# Patient Record
Sex: Male | Born: 1995 | Race: Black or African American | Hispanic: No | Marital: Single | Smoking: Current every day smoker
Health system: Southern US, Community
[De-identification: ages and names within clinical notes are randomized; demographics above are authoritative.]

## PROBLEM LIST (undated history)

## (undated) HISTORY — PX: OTHER SURGICAL HISTORY: SHX169

## (undated) HISTORY — PX: HERNIA REPAIR: SHX51

---
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
## 2017-06-24 ENCOUNTER — Encounter (HOSPITAL_COMMUNITY): Payer: Self-pay | Admitting: Emergency Medicine

## 2017-06-24 ENCOUNTER — Emergency Department (HOSPITAL_COMMUNITY)
Admission: EM | Admit: 2017-06-24 | Discharge: 2017-06-24 | Disposition: A | Discharge status: Home / Self Care | Payer: Self-pay | Attending: Emergency Medicine | Admitting: Emergency Medicine

## 2017-06-24 DIAGNOSIS — F17210 Nicotine dependence, cigarettes, uncomplicated: Secondary | ICD-10-CM | POA: Insufficient documentation

## 2017-06-24 DIAGNOSIS — R45851 Suicidal ideations: Secondary | ICD-10-CM | POA: Insufficient documentation

## 2017-06-24 NOTE — ED Triage Notes (Signed)
Patient arrived with [employer] officers handcuffed , [employer] reported that pt. stated suicidal ideation , his girlfriend reported to the police that he ingested 30 pills of unknown pills this evening , pt. Uncooperative at arrival - will not identify himself , he stated birthday Apr 12, 1996. Alert and respirations unlabored , argumentative with [employer] officers at triage .

## 2017-06-24 NOTE — ED Provider Notes (Signed)
[facility]-EMERGENCY DEPT Provider Note   CSN: [id number] Arrival date & time: 06/24/17  0135   By signing my name below, I, [name], attest that this documentation has been prepared under the direction and in the presence of [name], [name], * . Electronically Signed: [name], Scribe. 06/24/2017. 2:40 AM.   History   Chief Complaint Chief Complaint  Patient presents with  . Suicidal    The history is provided by the patient. No language interpreter was used.     HPI Comments:  Kyle Sherman is a 21 y.o. male who presents to the Emergency Department escorted by police for SI. Pt denies SI. He states he got into an argument with his girlfriend a few hours PTA and told her he was going to take pills. He took a bottle of ASA, emptied it onto the table and then drank water in front of his girlfriend  to make her think he'd overdosed so his girlfriend called 911. When he told her she hadn't taken the pills she hung up on 911 but police responded and brought pt to the ED.  He denies HI and SI at this time. Pt has no physical complaints or associated symptoms at this time.    History reviewed. No pertinent past medical history.  There are no active problems to display for this patient.   History reviewed. No pertinent surgical history.     Home Medications    Prior to Admission medications   Not on File    Family History No family history on file.  Social History Social History  Substance Use Topics  . Smoking status: Current Every Day Smoker  . Smokeless tobacco: Never Used  . Alcohol use Yes     Allergies   Patient has no known allergies.   Review of Systems Review of Systems  Constitutional: Negative for chills and fever.  Respiratory: Negative for shortness of breath.   Cardiovascular: Negative for chest pain.  Psychiatric/Behavioral: Negative for agitation, behavioral problems, self-injury and suicidal ideas. The patient is not  nervous/anxious.   All other systems reviewed and are negative.    Physical Exam Updated Vital Signs BP (!) 145/128 (BP Location: Right Arm)   Pulse 63   Temp 98.2 F (36.8 C) (Oral)   Resp 19   SpO2 95%   Physical Exam  Constitutional: He is oriented to person, place, and time. He appears well-developed and well-nourished. No distress.  HENT:  Head: Normocephalic and atraumatic.  Right Ear: Hearing normal.  Left Ear: Hearing normal.  Nose: Nose normal.  Mouth/Throat: Oropharynx is clear and moist and mucous membranes are normal.  Eyes: Conjunctivae and EOM are normal. Pupils are equal, round, and reactive to light.  Neck: Normal range of motion. Neck supple.  Cardiovascular: Regular rhythm, S1 normal and S2 normal.  Exam reveals no gallop and no friction rub.   No murmur heard. Pulmonary/Chest: Effort normal and breath sounds normal. No respiratory distress. He exhibits no tenderness.  Abdominal: Soft. Normal appearance and bowel sounds are normal. There is no hepatosplenomegaly. There is no tenderness. There is no rebound, no guarding, no tenderness at McBurney's point and negative Murphy's sign. No hernia.  Musculoskeletal: Normal range of motion.  Neurological: He is alert and oriented to person, place, and time. He has normal strength. No cranial nerve deficit or sensory deficit. Coordination normal. GCS eye subscore is 4. GCS verbal subscore is 5. GCS motor subscore is 6.  Skin: Skin is warm, dry  and intact. No rash noted. No cyanosis.  Psychiatric: He has a normal mood and affect. His speech is normal and behavior is normal. Thought content normal.  Nursing note and vitals reviewed.    ED Treatments / Results  DIAGNOSTIC STUDIES:  Oxygen Saturation is 95% on RA, adequate by my interpretation.    COORDINATION OF CARE:  2:39 AM Discussed treatment plan with pt at bedside and pt agreed to plan.  Labs (all labs ordered are listed, but only abnormal results are  displayed) Labs Reviewed - No data to display  EKG  EKG Interpretation None       Radiology No results found.  Procedures Procedures (including critical care time)  Medications Ordered in ED Medications - No data to display   Initial Impression / Assessment and Plan / ED Course  I have reviewed the triage vital signs and the nursing notes.  Pertinent labs & imaging results that were available during my care of the patient were reviewed by me and considered in my medical decision making (see chart for details).     Patient was brought to the emergency department by police. Patient reports that he had an argument with his friend earlier tonight, pretended to take a bunch of Advil tablets to get her attention. She called 911 when he made a gesture, police responded to the home. He reports this is all a misunderstanding in a mistake. He did not take any pills tonight. He never intended to take pills. He is not homicidal or suicidal. Patient reports full over his actions, contract for safety. I do not feel he warrants any further workup at this time, was given resources and counseled return if he has any thoughts of harming himself.  Final Clinical Impressions(s) / ED Diagnoses   Final diagnoses:  Suicidal ideation    New Prescriptions New Prescriptions   No medications on file  I personally performed the services described in this documentation, which was scribed in my presence. The recorded information has been reviewed and is accurate.     Gilda CreasePollina, Danella Philson J, MD 06/24/17 [phone]

## 2017-06-24 NOTE — ED Notes (Signed)
Pt refused discharge vitals 

## 2017-06-24 NOTE — ED Notes (Signed)
Pt st's he got into a argument with his girlfriend.  Pt st's he told his girlfriend that he was gonna take a bottle of pills (advil)  Pt st's he did not take them but his girlfriend had already called 911.  Pt denies being SI or HI.

## 2017-06-24 NOTE — ED Notes (Signed)
Pt attempting to find a ride home; no bus passes available

## 2017-06-24 NOTE — ED Notes (Signed)
Staffing office notified for pt.'s sitter . 

## 2017-07-31 ENCOUNTER — Encounter (HOSPITAL_COMMUNITY): Payer: Self-pay | Admitting: Emergency Medicine

## 2017-07-31 ENCOUNTER — Emergency Department (HOSPITAL_COMMUNITY)
Admission: EM | Admit: 2017-07-31 | Discharge: 2017-07-31 | Disposition: A | Discharge status: Home / Self Care | Payer: Self-pay | Attending: Emergency Medicine | Admitting: Emergency Medicine

## 2017-07-31 DIAGNOSIS — R110 Nausea: Secondary | ICD-10-CM | POA: Insufficient documentation

## 2017-07-31 DIAGNOSIS — F17210 Nicotine dependence, cigarettes, uncomplicated: Secondary | ICD-10-CM | POA: Insufficient documentation

## 2017-07-31 DIAGNOSIS — R05 Cough: Secondary | ICD-10-CM | POA: Insufficient documentation

## 2017-07-31 DIAGNOSIS — J00 Acute nasopharyngitis [common cold]: Secondary | ICD-10-CM | POA: Insufficient documentation

## 2017-07-31 LAB — RAPID STREP SCREEN (MED CTR MEBANE ONLY): Streptococcus, Group A Screen (Direct): NEGATIVE

## 2017-07-31 MED ORDER — ACETAMINOPHEN 325 MG PO TABS
650.0000 mg | ORAL_TABLET | Freq: Once | ORAL | Status: AC | PRN
  Administered 2017-07-31: 650 mg via ORAL
  Filled 2017-07-31: qty 2

## 2017-07-31 MED ORDER — ONDANSETRON 4 MG PO TBDP
ORAL_TABLET | ORAL | 0 refills | Status: DC
Start: 2017-07-31 — End: 2018-04-17

## 2017-07-31 MED ORDER — BENZONATATE 100 MG PO CAPS: 100.0000 mg | ORAL_CAPSULE | Freq: Three times a day (TID) | ORAL | 0 refills | Status: DC

## 2017-07-31 NOTE — Discharge Instructions (Signed)
Take tylenol 2 pills 4 times a day and motrin 4 pills 3 times a day.  Drink plenty of fluids.  Return for worsening shortness of breath, headache, confusion. Follow up with your family doctor.   Try Sudafed or congestion. This is the medication you have to show your driver's license and get from the pharmacy.

## 2017-07-31 NOTE — ED Provider Notes (Signed)
[facility]-EMERGENCY DEPT Provider Note   CSN: [id number] Arrival date & time: 07/31/17  4098     History   Chief Complaint Chief Complaint  Patient presents with  . Fever  . Sore Throat    HPI Kyle Sherman is a 21 y.o. male.  21 yo M with a cc of cough, congestion fevers, sore throat.  Going on for the past couple of days.  Denies sick contacts.  Has some nausea but denies vomiting. Diffuse myalgias. Denies tick bites. Denies dysuria. Denies abdominal pain. Denies shortness breath.   The history is provided by the patient.  Illness  This is a new problem. The current episode started less than 1 hour ago. The problem occurs constantly. The problem has not changed since onset.Associated symptoms include shortness of breath. Pertinent negatives include no chest pain, no abdominal pain and no headaches. Nothing aggravates the symptoms. Nothing relieves the symptoms. He has tried nothing for the symptoms. The treatment provided no relief.    No past medical history on file.  There are no active problems to display for this patient.   Past Surgical History:  Procedure Laterality Date  . Arm Tendon repair Right   . HERNIA REPAIR         Home Medications    Prior to Admission medications   Medication Sig Start Date End Date Taking? Authorizing Provider  benzonatate (TESSALON) 100 MG capsule Take 1 capsule (100 mg total) by mouth every 8 (eight) hours. 07/31/17   Melene Plan, DO  ondansetron (ZOFRAN ODT) 4 MG disintegrating tablet 4mg  ODT q4 hours prn nausea/vomit 07/31/17   Melene Plan, DO    Family History No family history on file.  Social History Social History  Substance Use Topics  . Smoking status: Current Every Day Smoker  . Smokeless tobacco: Never Used  . Alcohol use Yes     Allergies   Patient has no known allergies.   Review of Systems Review of Systems  Constitutional: Positive for fever. Negative for chills.  HENT: Positive for congestion. Negative  for facial swelling.   Eyes: Negative for discharge and visual disturbance.  Respiratory: Positive for shortness of breath.   Cardiovascular: Negative for chest pain and palpitations.  Gastrointestinal: Negative for abdominal pain, diarrhea and vomiting.  Musculoskeletal: Negative for arthralgias and myalgias.  Skin: Negative for color change and rash.  Neurological: Negative for tremors, syncope and headaches.  Psychiatric/Behavioral: Negative for confusion and dysphoric mood.     Physical Exam Updated Vital Signs BP 121/74 (BP Location: Right Arm)   Pulse 79   Temp 99.6 F (37.6 C) (Oral)   Resp 12   Ht 5\' 11"  (1.803 m)   Wt 68 kg (150 lb)   SpO2 97%   BMI 20.92 kg/m   Physical Exam  Constitutional: He is oriented to person, place, and time. He appears well-developed and well-nourished.  HENT:  Head: Normocephalic and atraumatic.  Swollen turbinates, posterior nasal drip, no noted sinus ttp, tm normal bilaterally.    Eyes: Pupils are equal, round, and reactive to light. EOM are normal.  Neck: Normal range of motion. Neck supple. No JVD present.  Cardiovascular: Normal rate and regular rhythm.  Exam reveals no gallop and no friction rub.   No murmur heard. Pulmonary/Chest: No respiratory distress. He has no wheezes.  Abdominal: He exhibits no distension and no mass. There is no tenderness. There is no rebound and no guarding.  Musculoskeletal: Normal range of motion.  Neurological: He  is alert and oriented to person, place, and time.  Skin: No rash noted. No pallor.  Psychiatric: He has a normal mood and affect. His behavior is normal.  Nursing note and vitals reviewed.    ED Treatments / Results  Labs (all labs ordered are listed, but only abnormal results are displayed) Labs Reviewed  RAPID STREP SCREEN (NOT AT [facility])  CULTURE, GROUP A STREP [facility]    EKG  EKG Interpretation None       Radiology No results found.  Procedures Procedures (including  critical care time)  Medications Ordered in ED Medications  acetaminophen (TYLENOL) tablet 650 mg (650 mg Oral Given 07/31/17 0857)     Initial Impression / Assessment and Plan / ED Course  I have reviewed the triage vital signs and the nursing notes.  Pertinent labs & imaging results that were available during my care of the patient were reviewed by me and considered in my medical decision making (see chart for details).     21 yo M with URI-like symptoms. Going on for the past couple days. No bacterial source found on exam. Will treat symptomatically. PCP follow-up.  10:05 AM:  I have discussed the diagnosis/risks/treatment options with the patient and family and believe the pt to be eligible for discharge home to follow-up with PCP. We also discussed returning to the ED immediately if new or worsening sx occur. We discussed the sx which are most concerning (e.g., sudden worsening pain, fever, inability to tolerate by mouth) that necessitate immediate return. Medications administered to the patient during their visit and any new prescriptions provided to the patient are listed below.  Medications given during this visit Medications  acetaminophen (TYLENOL) tablet 650 mg (650 mg Oral Given 07/31/17 0857)     The patient appears reasonably screen and/or stabilized for discharge and I doubt any other medical condition or other [facility] requiring further screening, evaluation, or treatment in the ED at this time prior to discharge.    Final Clinical Impressions(s) / ED Diagnoses   Final diagnoses:  Acute nasopharyngitis    New Prescriptions Discharge Medication List as of 07/31/2017  9:40 AM    START taking these medications   Details  benzonatate (TESSALON) 100 MG capsule Take 1 capsule (100 mg total) by mouth every 8 (eight) hours., Starting Thu 07/31/2017, Print    ondansetron (ZOFRAN ODT) 4 MG disintegrating tablet 4mg  ODT q4 hours prn nausea/vomit, Print         Melene PlanFloyd, Krystalle Pilkington,  DO 07/31/17 1005

## 2017-07-31 NOTE — ED Triage Notes (Signed)
Pt states that he has had a fever/chills since yesterday with sore throat and nausea after coughing. Alert and oriented.

## 2017-08-02 LAB — CULTURE, GROUP A STREP (THRC)

## 2018-04-17 ENCOUNTER — Inpatient Hospital Stay (HOSPITAL_COMMUNITY)
Admission: EM | Admit: 2018-04-17 | Discharge: 2018-04-23 | DRG: 481 | Disposition: A | Discharge status: Home / Self Care | Payer: No Typology Code available for payment source | Attending: Internal Medicine | Admitting: Internal Medicine

## 2018-04-17 ENCOUNTER — Encounter (HOSPITAL_COMMUNITY): Payer: Self-pay | Admitting: Internal Medicine

## 2018-04-17 ENCOUNTER — Emergency Department (HOSPITAL_COMMUNITY): Payer: No Typology Code available for payment source

## 2018-04-17 ENCOUNTER — Inpatient Hospital Stay (HOSPITAL_COMMUNITY): Payer: No Typology Code available for payment source

## 2018-04-17 DIAGNOSIS — F4325 Adjustment disorder with mixed disturbance of emotions and conduct: Secondary | ICD-10-CM | POA: Diagnosis present

## 2018-04-17 DIAGNOSIS — F17200 Nicotine dependence, unspecified, uncomplicated: Secondary | ICD-10-CM | POA: Diagnosis present

## 2018-04-17 DIAGNOSIS — Z01810 Encounter for preprocedural cardiovascular examination: Secondary | ICD-10-CM | POA: Diagnosis present

## 2018-04-17 DIAGNOSIS — Z590 Homelessness: Secondary | ICD-10-CM | POA: Diagnosis not present

## 2018-04-17 DIAGNOSIS — Z638 Other specified problems related to primary support group: Secondary | ICD-10-CM | POA: Diagnosis not present

## 2018-04-17 DIAGNOSIS — R45851 Suicidal ideations: Secondary | ICD-10-CM | POA: Diagnosis present

## 2018-04-17 DIAGNOSIS — F329 Major depressive disorder, single episode, unspecified: Secondary | ICD-10-CM | POA: Diagnosis present

## 2018-04-17 DIAGNOSIS — R402413 Glasgow coma scale score 13-15, at hospital admission: Secondary | ICD-10-CM | POA: Diagnosis present

## 2018-04-17 DIAGNOSIS — Y92410 Unspecified street and highway as the place of occurrence of the external cause: Secondary | ICD-10-CM | POA: Diagnosis not present

## 2018-04-17 DIAGNOSIS — F419 Anxiety disorder, unspecified: Secondary | ICD-10-CM | POA: Diagnosis present

## 2018-04-17 DIAGNOSIS — Z419 Encounter for procedure for purposes other than remedying health state, unspecified: Secondary | ICD-10-CM

## 2018-04-17 DIAGNOSIS — S72002A Fracture of unspecified part of neck of left femur, initial encounter for closed fracture: Secondary | ICD-10-CM | POA: Diagnosis present

## 2018-04-17 DIAGNOSIS — S72009A Fracture of unspecified part of neck of unspecified femur, initial encounter for closed fracture: Secondary | ICD-10-CM | POA: Diagnosis present

## 2018-04-17 LAB — CBC WITH DIFFERENTIAL/PLATELET
BASOS ABS: 0 10*3/uL (ref 0.0–0.1)
BASOS PCT: 0 %
EOS ABS: 0.1 10*3/uL (ref 0.0–0.7)
EOS PCT: 1 %
HCT: 38.2 % — ABNORMAL LOW (ref 39.0–52.0)
Hemoglobin: 13.4 g/dL (ref 13.0–17.0)
Lymphocytes Relative: 17 %
Lymphs Abs: 1.3 10*3/uL (ref 0.7–4.0)
MCH: 31 pg (ref 26.0–34.0)
MCHC: 35.1 g/dL (ref 30.0–36.0)
MCV: 88.4 fL (ref 78.0–100.0)
Monocytes Absolute: 0.4 10*3/uL (ref 0.1–1.0)
Monocytes Relative: 6 %
Neutro Abs: 5.9 10*3/uL (ref 1.7–7.7)
Neutrophils Relative %: 76 %
PLATELETS: 239 10*3/uL (ref 150–400)
RBC: 4.32 MIL/uL (ref 4.22–5.81)
RDW: 12.2 % (ref 11.5–15.5)
WBC: 7.7 10*3/uL (ref 4.0–10.5)

## 2018-04-17 LAB — BASIC METABOLIC PANEL
ANION GAP: 10 (ref 5–15)
BUN: 17 mg/dL (ref 6–20)
CO2: 22 mmol/L (ref 22–32)
Calcium: 8.8 mg/dL — ABNORMAL LOW (ref 8.9–10.3)
Chloride: 107 mmol/L (ref 101–111)
Creatinine, Ser: 1.22 mg/dL (ref 0.61–1.24)
GFR calc Af Amer: >60 mL/min (ref >60)
Glucose, Bld: 84 mg/dL (ref 65–99)
POTASSIUM: 3.7 mmol/L (ref 3.5–5.1)
SODIUM: 139 mmol/L (ref 135–145)

## 2018-04-17 LAB — TYPE AND SCREEN
ABO/RH(D): B POS
Antibody Screen: NEGATIVE

## 2018-04-17 MED ORDER — HYDROMORPHONE HCL 2 MG/ML IJ SOLN
0.5000 mg | Freq: Once | INTRAMUSCULAR | Status: AC
  Administered 2018-04-17: 0.5 mg via INTRAVENOUS
  Filled 2018-04-17: qty 1

## 2018-04-17 MED ORDER — TRAMADOL HCL 50 MG PO TABS
50.0000 mg | ORAL_TABLET | Freq: Four times a day (QID) | ORAL | Status: DC | PRN
  Administered 2018-04-18 – 2018-04-21 (×9): 50 mg via ORAL
  Filled 2018-04-17 (×10): qty 1

## 2018-04-17 MED ORDER — SODIUM CHLORIDE 0.9 % IV BOLUS
1000.0000 mL | Freq: Once | INTRAVENOUS | Status: AC
  Administered 2018-04-17: 1000 mL via INTRAVENOUS

## 2018-04-17 MED ORDER — ONDANSETRON HCL 4 MG/2ML IJ SOLN: 4.0000 mg | Freq: Four times a day (QID) | INTRAMUSCULAR | Status: DC | PRN

## 2018-04-17 MED ORDER — HYDROMORPHONE HCL 2 MG/ML IJ SOLN
0.5000 mg | INTRAMUSCULAR | Status: DC | PRN
  Administered 2018-04-18 (×3): 0.5 mg via INTRAVENOUS
  Filled 2018-04-17 (×4): qty 1

## 2018-04-17 MED ORDER — ACETAMINOPHEN 650 MG RE SUPP: 650.0000 mg | Freq: Four times a day (QID) | RECTAL | Status: DC | PRN

## 2018-04-17 MED ORDER — ACETAMINOPHEN 325 MG PO TABS
650.0000 mg | ORAL_TABLET | Freq: Four times a day (QID) | ORAL | Status: DC | PRN
  Administered 2018-04-22 – 2018-04-23 (×4): 650 mg via ORAL
  Filled 2018-04-17 (×4): qty 2

## 2018-04-17 MED ORDER — ENOXAPARIN SODIUM 40 MG/0.4ML ~~LOC~~ SOLN
40.0000 mg | Freq: Every day | SUBCUTANEOUS | Status: DC
  Filled 2018-04-17: qty 0.4

## 2018-04-17 MED ORDER — SODIUM CHLORIDE 0.9 % IV SOLN
INTRAVENOUS | Status: AC
  Administered 2018-04-18: via INTRAVENOUS

## 2018-04-17 MED ORDER — DIPHENHYDRAMINE HCL 50 MG/ML IJ SOLN: 25.0000 mg | Freq: Four times a day (QID) | INTRAMUSCULAR | Status: DC | PRN
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
## 2018-04-17 NOTE — ED Notes (Signed)
Patient transported to X-ray 
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
## 2018-04-17 NOTE — ED Notes (Signed)
Spoke with Dr. Selena BattenKim about patient NPO status. Stated he can eat until 0000
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
## 2018-04-18 ENCOUNTER — Inpatient Hospital Stay (HOSPITAL_COMMUNITY): Payer: No Typology Code available for payment source

## 2018-04-18 ENCOUNTER — Inpatient Hospital Stay (HOSPITAL_COMMUNITY): Payer: No Typology Code available for payment source | Admitting: Anesthesiology

## 2018-04-18 ENCOUNTER — Encounter (HOSPITAL_COMMUNITY)
Admission: EM | Disposition: A | Discharge status: Home / Self Care | Payer: Self-pay | Source: Home / Self Care | Attending: Family Medicine

## 2018-04-18 DIAGNOSIS — S72042A Displaced fracture of base of neck of left femur, initial encounter for closed fracture: Secondary | ICD-10-CM

## 2018-04-18 HISTORY — PX: FEMUR IM NAIL: SHX1597

## 2018-04-18 LAB — URINALYSIS, ROUTINE W REFLEX MICROSCOPIC
BACTERIA UA: NONE SEEN
Bilirubin Urine: NEGATIVE
Glucose, UA: NEGATIVE mg/dL
Ketones, ur: 20 mg/dL — AB
Nitrite: NEGATIVE
PH: 6 (ref 5.0–8.0)
Protein, ur: NEGATIVE mg/dL
SPECIFIC GRAVITY, URINE: 1.021 (ref 1.005–1.030)

## 2018-04-18 LAB — CBC
HEMATOCRIT: 37.1 % — AB (ref 39.0–52.0)
HEMOGLOBIN: 12.8 g/dL — AB (ref 13.0–17.0)
MCH: 30.9 pg (ref 26.0–34.0)
MCHC: 34.5 g/dL (ref 30.0–36.0)
MCV: 89.6 fL (ref 78.0–100.0)
PLATELETS: 222 10*3/uL (ref 150–400)
RBC: 4.14 MIL/uL — AB (ref 4.22–5.81)
RDW: 12.4 % (ref 11.5–15.5)
WBC: 6.5 10*3/uL (ref 4.0–10.5)

## 2018-04-18 LAB — RAPID URINE DRUG SCREEN, HOSP PERFORMED
AMPHETAMINES: POSITIVE — AB
BARBITURATES: NOT DETECTED
Benzodiazepines: NOT DETECTED
Cocaine: NOT DETECTED
Opiates: POSITIVE — AB
Tetrahydrocannabinol: NOT DETECTED

## 2018-04-18 LAB — COMPREHENSIVE METABOLIC PANEL
ALK PHOS: 38 U/L (ref 38–126)
ALT: 14 U/L — AB (ref 17–63)
AST: 27 U/L (ref 15–41)
Albumin: 3.8 g/dL (ref 3.5–5.0)
Anion gap: 9 (ref 5–15)
BUN: 13 mg/dL (ref 6–20)
CALCIUM: 8.8 mg/dL — AB (ref 8.9–10.3)
CHLORIDE: 106 mmol/L (ref 101–111)
CO2: 25 mmol/L (ref 22–32)
CREATININE: 1.28 mg/dL — AB (ref 0.61–1.24)
GFR calc Af Amer: >60 mL/min (ref >60)
Glucose, Bld: 89 mg/dL (ref 65–99)
Potassium: 3.6 mmol/L (ref 3.5–5.1)
Sodium: 140 mmol/L (ref 135–145)
Total Bilirubin: 1.8 mg/dL — ABNORMAL HIGH (ref 0.3–1.2)
Total Protein: 6.3 g/dL — ABNORMAL LOW (ref 6.5–8.1)

## 2018-04-18 LAB — HEPATIC FUNCTION PANEL
ALT: 14 U/L — ABNORMAL LOW (ref 17–63)
AST: 27 U/L (ref 15–41)
Albumin: 4.1 g/dL (ref 3.5–5.0)
Alkaline Phosphatase: 38 U/L (ref 38–126)
BILIRUBIN INDIRECT: 1.6 mg/dL — AB (ref 0.3–0.9)
BILIRUBIN TOTAL: 1.8 mg/dL — AB (ref 0.3–1.2)
Bilirubin, Direct: 0.2 mg/dL (ref 0.1–0.5)
Total Protein: 6.7 g/dL (ref 6.5–8.1)

## 2018-04-18 LAB — PROTIME-INR
INR: 1.1
PROTHROMBIN TIME: 14.1 s (ref 11.4–15.2)

## 2018-04-18 LAB — APTT: APTT: 36 s (ref 24–36)

## 2018-04-18 LAB — ABO/RH: ABO/RH(D): B POS

## 2018-04-18 LAB — HIV ANTIBODY (ROUTINE TESTING W REFLEX): HIV Screen 4th Generation wRfx: NONREACTIVE

## 2018-04-18 SURGERY — INSERTION, INTRAMEDULLARY ROD, FEMUR
Anesthesia: General | Site: Leg Lower | Laterality: Left

## 2018-04-18 MED ORDER — LIDOCAINE 2% (20 MG/ML) 5 ML SYRINGE
INTRAMUSCULAR | Status: AC
  Filled 2018-04-18: qty 5

## 2018-04-18 MED ORDER — DEXAMETHASONE SODIUM PHOSPHATE 10 MG/ML IJ SOLN
INTRAMUSCULAR | Status: AC
  Filled 2018-04-18: qty 1

## 2018-04-18 MED ORDER — LORAZEPAM 2 MG/ML IJ SOLN
0.5000 mg | Freq: Four times a day (QID) | INTRAMUSCULAR | Status: DC | PRN
  Administered 2018-04-18: 0.5 mg via INTRAVENOUS
  Filled 2018-04-18: qty 1

## 2018-04-18 MED ORDER — MIDAZOLAM HCL 2 MG/2ML IJ SOLN
INTRAMUSCULAR | Status: AC
  Filled 2018-04-18: qty 2

## 2018-04-18 MED ORDER — METOCLOPRAMIDE HCL 5 MG/ML IJ SOLN: 5.0000 mg | Freq: Three times a day (TID) | INTRAMUSCULAR | Status: DC | PRN

## 2018-04-18 MED ORDER — POVIDONE-IODINE 10 % EX SWAB: 2.0000 "application " | Freq: Once | CUTANEOUS | Status: DC

## 2018-04-18 MED ORDER — PROPOFOL 10 MG/ML IV BOLUS
INTRAVENOUS | Status: DC | PRN
  Administered 2018-04-18: 120 mg via INTRAVENOUS

## 2018-04-18 MED ORDER — PROPOFOL 10 MG/ML IV BOLUS
INTRAVENOUS | Status: AC
  Filled 2018-04-18: qty 20

## 2018-04-18 MED ORDER — ONDANSETRON HCL 4 MG/2ML IJ SOLN: 4.0000 mg | Freq: Four times a day (QID) | INTRAMUSCULAR | Status: DC | PRN

## 2018-04-18 MED ORDER — DOCUSATE SODIUM 100 MG PO CAPS
100.0000 mg | ORAL_CAPSULE | Freq: Two times a day (BID) | ORAL | Status: DC
  Administered 2018-04-19 – 2018-04-23 (×8): 100 mg via ORAL
  Filled 2018-04-18 (×7): qty 1

## 2018-04-18 MED ORDER — PHENYLEPHRINE HCL 10 MG/ML IJ SOLN
INTRAVENOUS | Status: DC | PRN
  Administered 2018-04-18: 10 ug/min via INTRAVENOUS

## 2018-04-18 MED ORDER — ONDANSETRON HCL 4 MG/2ML IJ SOLN
INTRAMUSCULAR | Status: AC
  Filled 2018-04-18: qty 2

## 2018-04-18 MED ORDER — LIDOCAINE HCL (CARDIAC) PF 100 MG/5ML IV SOSY
PREFILLED_SYRINGE | INTRAVENOUS | Status: DC | PRN
  Administered 2018-04-18: 60 mg via INTRAVENOUS

## 2018-04-18 MED ORDER — METOCLOPRAMIDE HCL 5 MG PO TABS: 5.0000 mg | ORAL_TABLET | Freq: Three times a day (TID) | ORAL | Status: DC | PRN

## 2018-04-18 MED ORDER — ENOXAPARIN SODIUM 40 MG/0.4ML ~~LOC~~ SOLN
40.0000 mg | SUBCUTANEOUS | Status: DC
  Administered 2018-04-19: 40 mg via SUBCUTANEOUS
  Filled 2018-04-18 (×3): qty 0.4

## 2018-04-18 MED ORDER — OXYCODONE HCL 5 MG PO TABS: 5.0000 mg | ORAL_TABLET | Freq: Once | ORAL | Status: DC | PRN

## 2018-04-18 MED ORDER — METHOCARBAMOL 1000 MG/10ML IJ SOLN: 500.0000 mg | Freq: Three times a day (TID) | INTRAVENOUS | Status: DC | PRN

## 2018-04-18 MED ORDER — PHENOL 1.4 % MT LIQD: 1.0000 | OROMUCOSAL | Status: DC | PRN

## 2018-04-18 MED ORDER — ROCURONIUM BROMIDE 100 MG/10ML IV SOLN
INTRAVENOUS | Status: DC | PRN
  Administered 2018-04-18: 50 mg via INTRAVENOUS

## 2018-04-18 MED ORDER — MENTHOL 3 MG MT LOZG
1.0000 | LOZENGE | OROMUCOSAL | Status: DC | PRN
  Administered 2018-04-21: 3 mg via ORAL
  Filled 2018-04-18 (×3): qty 9

## 2018-04-18 MED ORDER — 0.9 % SODIUM CHLORIDE (POUR BTL) OPTIME
TOPICAL | Status: DC | PRN
  Administered 2018-04-18: 1000 mL

## 2018-04-18 MED ORDER — ONDANSETRON HCL 4 MG/2ML IJ SOLN
INTRAMUSCULAR | Status: DC | PRN
  Administered 2018-04-18: 4 mg via INTRAVENOUS

## 2018-04-18 MED ORDER — ROCURONIUM BROMIDE 10 MG/ML (PF) SYRINGE
PREFILLED_SYRINGE | INTRAVENOUS | Status: AC
  Filled 2018-04-18: qty 5

## 2018-04-18 MED ORDER — SODIUM CHLORIDE 0.45 % IV SOLN: INTRAVENOUS | Status: DC

## 2018-04-18 MED ORDER — CEFAZOLIN SODIUM-DEXTROSE 2-4 GM/100ML-% IV SOLN
INTRAVENOUS | Status: AC
  Filled 2018-04-18: qty 100

## 2018-04-18 MED ORDER — DEXAMETHASONE SODIUM PHOSPHATE 10 MG/ML IJ SOLN
INTRAMUSCULAR | Status: DC | PRN
  Administered 2018-04-18: 5 mg via INTRAVENOUS

## 2018-04-18 MED ORDER — ONDANSETRON HCL 4 MG PO TABS: 4.0000 mg | ORAL_TABLET | Freq: Four times a day (QID) | ORAL | Status: DC | PRN

## 2018-04-18 MED ORDER — OXYCODONE HCL 5 MG/5ML PO SOLN: 5.0000 mg | Freq: Once | ORAL | Status: DC | PRN

## 2018-04-18 MED ORDER — HYDROMORPHONE HCL 2 MG/ML IJ SOLN: 0.3000 mg | INTRAMUSCULAR | Status: DC | PRN

## 2018-04-18 MED ORDER — FENTANYL CITRATE (PF) 100 MCG/2ML IJ SOLN
INTRAMUSCULAR | Status: DC | PRN
  Administered 2018-04-18: 100 ug via INTRAVENOUS

## 2018-04-18 MED ORDER — BUPIVACAINE-EPINEPHRINE 0.5% -1:200000 IJ SOLN
INTRAMUSCULAR | Status: DC | PRN
  Administered 2018-04-18: 12 mL

## 2018-04-18 MED ORDER — LACTATED RINGERS IV SOLN
INTRAVENOUS | Status: DC
  Administered 2018-04-18 (×3): via INTRAVENOUS

## 2018-04-18 MED ORDER — FENTANYL CITRATE (PF) 250 MCG/5ML IJ SOLN
INTRAMUSCULAR | Status: AC
  Filled 2018-04-18: qty 5

## 2018-04-18 MED ORDER — CHLORHEXIDINE GLUCONATE 4 % EX LIQD: 60.0000 mL | Freq: Once | CUTANEOUS | Status: DC

## 2018-04-18 MED ORDER — SUGAMMADEX SODIUM 200 MG/2ML IV SOLN
INTRAVENOUS | Status: DC | PRN
  Administered 2018-04-18: 150 mg via INTRAVENOUS

## 2018-04-18 MED ORDER — BUPIVACAINE HCL (PF) 0.25 % IJ SOLN
INTRAMUSCULAR | Status: AC
  Filled 2018-04-18: qty 30

## 2018-04-18 MED ORDER — CEFAZOLIN SODIUM-DEXTROSE 2-4 GM/100ML-% IV SOLN
2.0000 g | INTRAVENOUS | Status: AC
  Administered 2018-04-18: 2 g via INTRAVENOUS

## 2018-04-18 SURGICAL SUPPLY — 45 items
BIT DRILL 4.8X300 (BIT) ×1 IMPLANT
BIT DRILL 4.8X300MM (BIT) ×1
BLADE SURG 15 STRL LF DISP TIS (BLADE) ×1 IMPLANT
BLADE SURG 15 STRL SS (BLADE) ×3
BNDG COHESIVE 4X5 TAN STRL (GAUZE/BANDAGES/DRESSINGS) ×3 IMPLANT
COVER BACK TABLE 60X90IN (DRAPES) ×3 IMPLANT
COVER MAYO STAND STRL (DRAPES) ×3 IMPLANT
COVER PERINEAL POST (MISCELLANEOUS) ×3 IMPLANT
COVER SURGICAL LIGHT HANDLE (MISCELLANEOUS) ×3 IMPLANT
DRAPE C-ARM 42X72 X-RAY (DRAPES) ×3 IMPLANT
DRAPE STERI IOBAN 125X83 (DRAPES) ×3 IMPLANT
DRSG ADAPTIC 3X8 NADH LF (GAUZE/BANDAGES/DRESSINGS) ×3 IMPLANT
DRSG MEPILEX BORDER 4X4 (GAUZE/BANDAGES/DRESSINGS) ×2 IMPLANT
DRSG PAD ABDOMINAL 8X10 ST (GAUZE/BANDAGES/DRESSINGS) ×3 IMPLANT
DURAPREP 26ML APPLICATOR (WOUND CARE) ×3 IMPLANT
ELECT REM PT RETURN 9FT ADLT (ELECTROSURGICAL) ×3
ELECTRODE REM PT RTRN 9FT ADLT (ELECTROSURGICAL) ×1 IMPLANT
EVACUATOR 1/8 PVC DRAIN (DRAIN) IMPLANT
GAUZE SPONGE 4X4 12PLY STRL (GAUZE/BANDAGES/DRESSINGS) ×3 IMPLANT
GLOVE BIOGEL PI IND STRL 8 (GLOVE) ×2 IMPLANT
GLOVE BIOGEL PI INDICATOR 8 (GLOVE) ×4
GLOVE ORTHO TXT STRL SZ7.5 (GLOVE) ×6 IMPLANT
GOWN STRL REUS W/ TWL LRG LVL3 (GOWN DISPOSABLE) ×1 IMPLANT
GOWN STRL REUS W/ TWL XL LVL3 (GOWN DISPOSABLE) ×1 IMPLANT
GOWN STRL REUS W/TWL 2XL LVL3 (GOWN DISPOSABLE) ×3 IMPLANT
GOWN STRL REUS W/TWL LRG LVL3 (GOWN DISPOSABLE) ×3
GOWN STRL REUS W/TWL XL LVL3 (GOWN DISPOSABLE) ×3
KIT BASIN OR (CUSTOM PROCEDURE TRAY) ×3 IMPLANT
KIT TURNOVER KIT B (KITS) ×3 IMPLANT
LINER BOOT UNIVERSAL DISP (MISCELLANEOUS) ×3 IMPLANT
MANIFOLD NEPTUNE II (INSTRUMENTS) ×3 IMPLANT
NS IRRIG 1000ML POUR BTL (IV SOLUTION) ×3 IMPLANT
PACK GENERAL/GYN (CUSTOM PROCEDURE TRAY) ×3 IMPLANT
PAD ARMBOARD 7.5X6 YLW CONV (MISCELLANEOUS) ×6 IMPLANT
PIN GUIDE DRILL TIP 2.8X300 (DRILL) ×6 IMPLANT
SCREW CANN 6.5X90 (Screw) ×2 IMPLANT
SCREW CANN 6.5X90 16MM THD (Screw) IMPLANT
SCREW CANNULATED 6.5X90MM (Screw) ×6 IMPLANT
SCREW CANNULATED 6.5X95 HIP (Screw) ×2 IMPLANT
STAPLER VISISTAT 35W (STAPLE) IMPLANT
SUT VIC AB 0 CT1 27 (SUTURE) ×3
SUT VIC AB 0 CT1 27XBRD ANBCTR (SUTURE) ×1 IMPLANT
SUT VIC AB 2-0 CT1 27 (SUTURE) ×6
SUT VIC AB 2-0 CT1 TAPERPNT 27 (SUTURE) ×2 IMPLANT
WATER STERILE IRR 1000ML POUR (IV SOLUTION) ×6 IMPLANT

## 2018-04-18 NOTE — Transfer of Care (Signed)
Immediate Anesthesia Transfer of Care Note  Patient: Kyle Sherman  Procedure(s) Performed: LEFT FEMORAL NECK PINNING (Left Leg Lower)  Patient Location: PACU  Anesthesia Type:General  Level of Consciousness: awake, sedated and drowsy  Airway & Oxygen Therapy: Patient Spontanous Breathing and Patient connected to nasal cannula oxygen  Post-op Assessment: Report given to RN, Post -op Vital signs reviewed and stable and Patient moving all extremities  Post vital signs: Reviewed and stable  Last Vitals:  Vitals Value Taken Time  BP 119/77 04/18/2018  2:54 PM  Temp 36.9 C 04/18/2018  2:54 PM  Pulse 85 04/18/2018  2:58 PM  Resp 24 04/18/2018  2:58 PM  SpO2 95 % 04/18/2018  2:58 PM  Vitals shown include unvalidated device data.  Last Pain:  Vitals:   04/18/18 0956  PainSc: 8          Complications: No apparent anesthesia complications
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
## 2018-04-18 NOTE — Progress Notes (Signed)
Patient came down with unsigned consent. Patient given dilaudid prior to leave 5C. Dr. Ophelia Charter notified. We will contact grandmother for consent.

## 2018-04-18 NOTE — Anesthesia Procedure Notes (Signed)
Procedure Name: Intubation Date/Time: 04/18/2018 1:43 PM Performed by: Coralee Rud, CRNA Pre-anesthesia Checklist: Patient identified, Emergency Drugs available, Suction available and Patient being monitored Patient Re-evaluated:Patient Re-evaluated prior to induction Oxygen Delivery Method: Circle system utilized Preoxygenation: Pre-oxygenation with 100% oxygen Induction Type: IV induction Ventilation: Mask ventilation without difficulty Laryngoscope Size: Miller and 3 Grade View: Grade I Tube type: Oral Tube size: 7.5 mm Number of attempts: 1 Airway Equipment and Method: Stylet Placement Confirmation: ETT inserted through vocal cords under direct vision,  positive ETCO2 and breath sounds checked- equal and bilateral Secured at: 22 cm Tube secured with: Tape Dental Injury: Teeth and Oropharynx as per pre-operative assessment
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
## 2018-04-18 NOTE — Anesthesia Postprocedure Evaluation (Signed)
Anesthesia Post Note  Patient: BRAD LIEURANCE II  Procedure(s) Performed: LEFT FEMORAL NECK PINNING (Left Leg Lower)     Patient location during evaluation: PACU Anesthesia Type: General Level of consciousness: awake and alert Pain management: pain level controlled Vital Signs Assessment: post-procedure vital signs reviewed and stable Respiratory status: spontaneous breathing, nonlabored ventilation, respiratory function stable and patient connected to nasal cannula oxygen Cardiovascular status: blood pressure returned to baseline and stable Postop Assessment: no apparent nausea or vomiting Anesthetic complications: no    Last Vitals:  Vitals:   04/18/18 1554 04/18/18 1609  BP: 119/78 125/81  Pulse: (!) 59 61  Resp: 14 12  Temp:    SpO2: 96% 94%    Last Pain:  Vitals:   04/18/18 1554  PainSc: 0-No pain                 Twylla Arceneaux
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
## 2018-04-18 NOTE — Interval H&P Note (Signed)
History and Physical Interval Note:  04/18/2018 12:35 PM  Kyle Sherman  has presented today for surgery, with the diagnosis of LEFT FEMORAL NECK FRACTURE  The various methods of treatment have been discussed with the patient and family. After consideration of risks, benefits and other options for treatment, the patient has consented to  Procedure(s): LEFT FEMORAL NECK PINNING (Left) as a surgical intervention .  The patient's history has been reviewed, patient examined, no change in status, stable for surgery.  I have reviewed the patient's chart and labs.  Questions were answered to the patient's satisfaction.     Eldred Manges

## 2018-04-18 NOTE — Anesthesia Preprocedure Evaluation (Signed)
Anesthesia Evaluation  Patient identified by MRN, date of birth, ID band Patient awake    Reviewed: Allergy & Precautions, NPO status , Patient's Chart, lab work & pertinent test results  History of Anesthesia Complications Negative for: history of anesthetic complications  Airway Mallampati: I  TM Distance: >3 FB Neck ROM: Full    Dental  (+) Teeth Intact   Pulmonary neg shortness of breath, neg sleep apnea, neg COPD, neg recent URI, Current Smoker,    breath sounds clear to auscultation       Cardiovascular negative cardio ROS   Rhythm:Regular     Neuro/Psych negative neurological ROS     GI/Hepatic negative GI ROS, Neg liver ROS,   Endo/Other  negative endocrine ROS  Renal/GU negative Renal ROS     Musculoskeletal Left femoral neck fx   Abdominal   Peds  Hematology negative hematology ROS (+)   Anesthesia Other Findings   Reproductive/Obstetrics                             Anesthesia Physical Anesthesia Plan  ASA: II  Anesthesia Plan: General   Post-op Pain Management:    Induction: Intravenous  PONV Risk Score and Plan: 1 and Ondansetron  Airway Management Planned: Oral ETT  Additional Equipment: None  Intra-op Plan:   Post-operative Plan: Extubation in OR  Informed Consent: I have reviewed the patients History and Physical, chart, labs and discussed the procedure including the risks, benefits and alternatives for the proposed anesthesia with the patient or authorized representative who has indicated his/her understanding and acceptance.   Dental advisory given  Plan Discussed with: CRNA and Surgeon  Anesthesia Plan Comments:         Anesthesia Quick Evaluation
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
## 2018-04-19 LAB — BASIC METABOLIC PANEL
Anion gap: 9 (ref 5–15)
BUN: 10 mg/dL (ref 6–20)
CALCIUM: 8.6 mg/dL — AB (ref 8.9–10.3)
CO2: 26 mmol/L (ref 22–32)
CREATININE: 1.03 mg/dL (ref 0.61–1.24)
Chloride: 103 mmol/L (ref 101–111)
GFR calc Af Amer: >60 mL/min (ref >60)
GFR calc non Af Amer: >60 mL/min (ref >60)
GLUCOSE: 105 mg/dL — AB (ref 65–99)
Potassium: 3.8 mmol/L (ref 3.5–5.1)
Sodium: 138 mmol/L (ref 135–145)

## 2018-04-19 LAB — CBC
HEMATOCRIT: 37.5 % — AB (ref 39.0–52.0)
Hemoglobin: 12.9 g/dL — ABNORMAL LOW (ref 13.0–17.0)
MCH: 30.4 pg (ref 26.0–34.0)
MCHC: 34.4 g/dL (ref 30.0–36.0)
MCV: 88.2 fL (ref 78.0–100.0)
Platelets: 229 10*3/uL (ref 150–400)
RBC: 4.25 MIL/uL (ref 4.22–5.81)
RDW: 12 % (ref 11.5–15.5)
WBC: 8.4 10*3/uL (ref 4.0–10.5)

## 2018-04-19 MED ORDER — POLYETHYLENE GLYCOL 3350 17 G PO PACK
17.0000 g | PACK | Freq: Every day | ORAL | Status: DC
  Administered 2018-04-19 – 2018-04-23 (×4): 17 g via ORAL
  Filled 2018-04-19 (×3): qty 1
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
## 2018-04-19 NOTE — Progress Notes (Signed)
Pt stated that he wanted to remove his dressing and Both RN's told him not to because the doctor handles his dressing and that could put him at a rick for infection patient stated that he was going to remove the dressing after we left the room and was being very rude.
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
## 2018-04-19 NOTE — Progress Notes (Signed)
PT Cancellation Note  Patient Details Name: Kyle Sherman MRN: [id number] DOB: 1996-10-04   Cancelled Treatment:    Reason Eval/Treat Not Completed: Patient declined, no reason specified. Patient adamantly refusing therapy services. He states he got up previously this morning and that PT should "go talk to him about how he got up by himself." PT provided max encouragement for out of bed mobility. Will follow up.   Laurina Bustle, PT, DPT Acute Rehabilitation Services  Pager: [phone]    [name] 04/19/2018, 9:18 AM

## 2018-04-20 ENCOUNTER — Encounter (HOSPITAL_COMMUNITY): Payer: Self-pay | Admitting: Orthopaedic Surgery

## 2018-04-20 LAB — MAGNESIUM: MAGNESIUM: 1.7 mg/dL (ref 1.7–2.4)

## 2018-04-20 LAB — BASIC METABOLIC PANEL
ANION GAP: 4 — AB (ref 5–15)
BUN: 11 mg/dL (ref 6–20)
CALCIUM: 8.5 mg/dL — AB (ref 8.9–10.3)
CO2: 28 mmol/L (ref 22–32)
Chloride: 105 mmol/L (ref 101–111)
Creatinine, Ser: 0.99 mg/dL (ref 0.61–1.24)
GFR calc Af Amer: >60 mL/min (ref >60)
GLUCOSE: 92 mg/dL (ref 65–99)
POTASSIUM: 3.8 mmol/L (ref 3.5–5.1)
SODIUM: 137 mmol/L (ref 135–145)

## 2018-04-20 LAB — CBC
HCT: 34.8 % — ABNORMAL LOW (ref 39.0–52.0)
Hemoglobin: 11.9 g/dL — ABNORMAL LOW (ref 13.0–17.0)
MCH: 30.7 pg (ref 26.0–34.0)
MCHC: 34.2 g/dL (ref 30.0–36.0)
MCV: 89.7 fL (ref 78.0–100.0)
Platelets: 212 10*3/uL (ref 150–400)
RBC: 3.88 MIL/uL — AB (ref 4.22–5.81)
RDW: 12.5 % (ref 11.5–15.5)
WBC: 6.1 10*3/uL (ref 4.0–10.5)

## 2018-04-20 MED ORDER — TRAMADOL HCL 50 MG PO TABS: 50.0000 mg | ORAL_TABLET | Freq: Four times a day (QID) | ORAL | 0 refills | Status: AC | PRN

## 2018-04-20 MED ORDER — ASPIRIN 325 MG PO TABS: 325.0000 mg | ORAL_TABLET | Freq: Every day | ORAL | 0 refills | Status: DC
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
## 2018-04-20 NOTE — Progress Notes (Signed)
PT Cancellation Note  Patient Details Name: CHALMERS IDDINGS MRN: [id number] DOB: 11/01/1996   Cancelled Treatment:    Reason Eval/Treat Not Completed: Patient declined, no reason specified. Declining PT services secondary to wanting a bath first. Will follow up as schedule allows.  Laurina Bustle, PT, DPT Acute Rehabilitation Services  Pager: [phone]    [name] 04/20/2018, 1:17 PM
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
## 2018-04-20 NOTE — Progress Notes (Signed)
Patient is noncompliant- refuses to listen to the nurses or techs.  Patient states that he is going to do what he wants to do.  The patient is ambulating with a walker and leaving the floor, to visit his girlfriend.

## 2018-04-20 NOTE — Discharge Instructions (Signed)
Do not put any weight on you left foot. Use walker / crutches. See dr. Ophelia Charter in one week. OK to shower. If pants rub on incision you can apply a large band aid over the stitches. Call in there is reddness or drainage from the incision. Dr. Ophelia Charter office phone is [phone]

## 2018-04-21 MED ORDER — OXYCODONE HCL 5 MG PO TABS
5.0000 mg | ORAL_TABLET | Freq: Four times a day (QID) | ORAL | Status: DC | PRN
  Administered 2018-04-21 – 2018-04-23 (×5): 5 mg via ORAL
  Filled 2018-04-21 (×5): qty 1

## 2018-04-21 NOTE — Care Management Note (Signed)
Case Management Note  Patient Details  Name: Kyle Sherman MRN: [id number] Date of Birth: [birth year]  Subjective/Objective:                    Action/Plan:     Expected Discharge Date:   pending               Expected Discharge Plan:  Home/Self Care  In-House Referral:  NA  Discharge planning Services  CM Consult  Post Acute Care Choice:  Durable Medical Equipment Choice offered to:  NA  DME Arranged:  Walker rolling DME Agency:  [facility]  HH Arranged:    [facility] Agency:     Status of Service:  In process, will continue to follow  If discussed at Long Length of Stay Meetings, dates discussed:    Additional Comments:  Durenda Guthrie, RN 04/21/2018, 3:25 PM
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
## 2018-04-21 NOTE — Progress Notes (Signed)
RN just had 1:1 with the patient and explain about bedside safety sitter, patient understands and agrees to have a sitter at bedside, patient looks very depressed and states that he lived with his grand parents in [place] but now the grand parents do not want him to live with them, patient refused to answer a question about being suicidal, he stated "I'm not answering that question"

## 2018-04-21 NOTE — Progress Notes (Signed)
Dr Lowell Guitar paged to update regarding patient refusing his lovenox.

## 2018-04-21 NOTE — Progress Notes (Signed)
   Subjective: 3 Days Post-Op Procedure(s) (LRB): LEFT FEMORAL NECK PINNING (Left) Patient reports pain as mild and moderate.    Objective: Vital signs in last 24 hours: Temp:  [98.1 F (36.7 C)-98.6 F (37 C)] 98.1 F (36.7 C) (04/30 0433) Pulse Rate:  [59-78] 59 (04/30 0433) Resp:  [16] 16 (04/30 0433) BP: (98-124)/(58-60) 124/60 (04/30 0433) SpO2:  [100 %] 100 % (04/30 0433)  Intake/Output from previous day: 04/29 0701 - 04/30 0700 In: 720 [P.O.:720] Out: 1200 [Urine:1200] Intake/Output this shift: No intake/output data recorded.  Recent Labs    04/19/18 0623 04/20/18 0559  HGB 12.9* 11.9*   Recent Labs    04/19/18 0623 04/20/18 0559  WBC 8.4 6.1  RBC 4.25 3.88*  HCT 37.5* 34.8*  PLT 229 212   Recent Labs    04/19/18 0623 04/20/18 0559  NA 138 137  K 3.8 3.8  CL 103 105  CO2 26 28  BUN 10 11  CREATININE 1.03 0.99  GLUCOSE 105* 92  CALCIUM 8.6* 8.5*   No results for input(s): LABPT, INR in the last 72 hours.  Neurologically intact No results found.  Assessment/Plan: 3 Days Post-Op Procedure(s) (LRB): LEFT FEMORAL NECK PINNING (Left) Stable post op . He can followup with me in one- two weeks in the office  Kyle Sherman 04/21/2018, 7:50 AM
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
## 2018-04-22 DIAGNOSIS — F1190 Opioid use, unspecified, uncomplicated: Secondary | ICD-10-CM

## 2018-04-22 DIAGNOSIS — F17200 Nicotine dependence, unspecified, uncomplicated: Secondary | ICD-10-CM

## 2018-04-22 DIAGNOSIS — R4587 Impulsiveness: Secondary | ICD-10-CM

## 2018-04-22 DIAGNOSIS — F4325 Adjustment disorder with mixed disturbance of emotions and conduct: Secondary | ICD-10-CM

## 2018-04-22 DIAGNOSIS — R454 Irritability and anger: Secondary | ICD-10-CM

## 2018-04-22 DIAGNOSIS — F1590 Other stimulant use, unspecified, uncomplicated: Secondary | ICD-10-CM

## 2018-04-22 DIAGNOSIS — R451 Restlessness and agitation: Secondary | ICD-10-CM

## 2018-04-22 DIAGNOSIS — Z590 Homelessness: Secondary | ICD-10-CM

## 2018-04-22 MED ORDER — ASPIRIN EC 325 MG PO TBEC
325.0000 mg | DELAYED_RELEASE_TABLET | Freq: Every day | ORAL | Status: DC
Start: 2018-04-22 — End: 2018-04-23
  Administered 2018-04-23: 325 mg via ORAL
  Filled 2018-04-22: qty 1

## 2018-04-22 NOTE — Progress Notes (Signed)
   Subjective: 4 Days Post-Op Procedure(s) (LRB): LEFT FEMORAL NECK PINNING (Left) Patient reports pain as mild.    Objective: Vital signs in last 24 hours: Temp:  [98.4 F (36.9 C)-98.8 F (37.1 C)] 98.4 F (36.9 C) (05/01 0620) Pulse Rate:  [56-59] 56 (05/01 0620) Resp:  [18-20] 20 (05/01 0620) BP: (108-110)/(73-81) 110/73 (05/01 0620) SpO2:  [99 %-100 %] 100 % (05/01 0620)  Intake/Output from previous day: 04/30 0701 - 05/01 0700 In: 720 [P.O.:720] Out: 200 [Urine:200] Intake/Output this shift: No intake/output data recorded.  Recent Labs    04/20/18 0559  HGB 11.9*   Recent Labs    04/20/18 0559  WBC 6.1  RBC 3.88*  HCT 34.8*  PLT 212   Recent Labs    04/20/18 0559  NA 137  K 3.8  CL 105  CO2 28  BUN 11  CREATININE 0.99  GLUCOSE 92  CALCIUM 8.5*   No results for input(s): LABPT, INR in the last 72 hours.  Neurologically intact No results found.  Assessment/Plan: 4 Days Post-Op Procedure(s) (LRB): LEFT FEMORAL NECK PINNING (Left) Up with therapy, NWB left LE times 6 wks. Will sign off. He will come see me in 2 wks  Eldred Manges 04/22/2018, 8:08 AM

## 2018-04-22 NOTE — Social Work (Signed)
CSW contacted [employer] to f/u on assistance. Due to patient's impairment and use of wheel chair he would not be able to go to their facility. They indicated that they cannot provide any care or supervision with new impairment.  CSW then discussed another housing program and patient would not a good candidate due to impairment.  CSW then discussed case with Asst. Clini Director Z.Brooks and he indicated that he could make patient a difficult to place for a SNF if he was agreeable and he would go on waitlist. CSW will f/u with patient and discuss as patient previous indicated that he desired to get back to motel with pets.  Keene Breath, LCSW Clinical Social Worker [phone]
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
## 2018-04-22 NOTE — Social Work (Addendum)
CSW met with patient at bedside along with floor nurse and sitter and discussed IVC as it appears law enforcement did not physically serve patient IVC paperwork. Pt not pleased that he is not able to leave. CSW validated his feelings and explored his emotional state. Pt endorsed being sad and depressed about new impairment. Pt has many social issues that impact his mood. Pt denied s/i and h/i. CSW encouraged patient to engage with psychiatrist. Floor RN will f/u with  psych as patient willing to meet and engage. Pt indicated that he desires to get home to his dogs and is concerned that the motel will put his personal items out of on the street if he is not there tomorrow. Pt unhappy that he is IVC'd and feels that we have taken his "rights" away. CSW validated his feelings and identified the role of the clinical team to keep him safe.  Pt unable to reframe the negative thinking despite attempts by CSW. Pt very concerned about the discharge plan and his pets.  Elissa Hefty, LCSW Clinical Social Worker [phone]
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
## 2018-04-22 NOTE — Progress Notes (Signed)
OT Cancellation Note  Patient Details Name: Kyle Sherman MRN: [id number] DOB: [birth year]   Cancelled Treatment:    Reason Eval/Treat Not Completed: Other (comment). Pt refusing OT and wanting to leave AMA; he is at RN station in w/c having AMA consent being read to him by RN   Karleen Hampshire, Ronie Spies 04/22/2018, 1:31 PM

## 2018-04-22 NOTE — Social Work (Addendum)
CSW was advised by Dr. Roda Shutters that psych is reocmmending patient be IVC'd( Involuntary Commitment) .  CSW will complete IVC  forms and fax to magistrate. Pt will then be served by [employer].  CSW will f/u.  3:00pm CSW contacted Magistrate and confirmed that IV paperwork was received.  3:15pm CSW received custody order faxed back. CSW then called [employer] and requested an Technical sales engineer to serve patient with IVC paperwork.  CSW then placed IVC documents on the patients chart.    IVC paperwork is for 7 days and expires 04/29/2018.  Keene Breath, LCSW Clinical Social Worker [phone]

## 2018-04-23 DIAGNOSIS — Z008 Encounter for other general examination: Secondary | ICD-10-CM

## 2018-04-23 DIAGNOSIS — F4325 Adjustment disorder with mixed disturbance of emotions and conduct: Secondary | ICD-10-CM

## 2018-04-23 DIAGNOSIS — F1910 Other psychoactive substance abuse, uncomplicated: Secondary | ICD-10-CM

## 2018-04-23 DIAGNOSIS — J029 Acute pharyngitis, unspecified: Secondary | ICD-10-CM

## 2018-04-23 MED ORDER — ASPIRIN EC 81 MG PO TBEC: 81.0000 mg | DELAYED_RELEASE_TABLET | Freq: Every day | ORAL | 0 refills | Status: DC

## 2018-04-23 MED ORDER — POLYETHYLENE GLYCOL 3350 17 G PO PACK: 17.0000 g | PACK | Freq: Every day | ORAL | 0 refills | Status: AC

## 2018-04-23 MED ORDER — ACETAMINOPHEN 325 MG PO TABS: 650.0000 mg | ORAL_TABLET | Freq: Four times a day (QID) | ORAL | 0 refills | Status: AC | PRN

## 2018-04-23 MED ORDER — ZIPRASIDONE MESYLATE 20 MG IM SOLR
10.0000 mg | Freq: Four times a day (QID) | INTRAMUSCULAR | Status: DC | PRN
  Filled 2018-04-23: qty 20

## 2018-04-23 MED ORDER — ASPIRIN 325 MG PO TBEC: 325.0000 mg | DELAYED_RELEASE_TABLET | Freq: Every day | ORAL | 0 refills | Status: AC

## 2018-04-23 NOTE — Progress Notes (Signed)
Pt refuses telemetry monitoring at this time.  Pt has been educated on the risks and continues to refuse.  [id number]

## 2018-04-23 NOTE — Social Work (Addendum)
CSW was advised by Dr. Roda Shutters that she is clearing patient to discharge today. CSW has rescinded IVC and faxed Notice of Commitment Change Form to [employer].   CSW provided transportation assistance.  CSW will sign off for now as social work intervention is no longer needed. Please consult [place] again if new need arises.  Keene Breath, LCSW Clinical Social Worker [phone]

## 2018-04-23 NOTE — Progress Notes (Signed)
Chaplain was asked by nurse and security to visit with the PT and help him calm down, providing a general assessment of any pastoral  / spiritual needs.  PT just seems like he is ready to leave the hospital.

## 2018-04-23 NOTE — Plan of Care (Signed)
  Problem: Pain Managment: Goal: General experience of comfort will improve Outcome: Progressing   

## 2018-04-23 NOTE — Progress Notes (Signed)
Pt discharged to home in stable condition.  All discharge instructions and Rx's reviewed and given to Pt.  [employer] taxi called to transport pt.  Pt transported off unit via wheelchair. [id number]
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
## 2018-05-05 ENCOUNTER — Ambulatory Visit (INDEPENDENT_AMBULATORY_CARE_PROVIDER_SITE_OTHER): Payer: Self-pay | Admitting: Orthopaedic Surgery

## 2018-05-05 ENCOUNTER — Ambulatory Visit (INDEPENDENT_AMBULATORY_CARE_PROVIDER_SITE_OTHER): Payer: Self-pay

## 2018-05-05 ENCOUNTER — Encounter (INDEPENDENT_AMBULATORY_CARE_PROVIDER_SITE_OTHER): Payer: Self-pay | Admitting: Orthopaedic Surgery

## 2018-05-05 DIAGNOSIS — S72002A Fracture of unspecified part of neck of left femur, initial encounter for closed fracture: Secondary | ICD-10-CM

## 2018-05-05 DIAGNOSIS — S72002S Fracture of unspecified part of neck of left femur, sequela: Secondary | ICD-10-CM

## 2018-05-05 DIAGNOSIS — Z9119 Patient's noncompliance with other medical treatment and regimen: Secondary | ICD-10-CM

## 2018-05-05 DIAGNOSIS — Z91199 Patient's noncompliance with other medical treatment and regimen due to unspecified reason: Secondary | ICD-10-CM

## 2018-05-05 NOTE — Progress Notes (Signed)
   Post-Op Visit Note   Patient: Kyle Sherman           Date of Birth: 1995-12-28           MRN: [id number] Visit Date: 05/05/2018 PCP: Patient, No Pcp Per   Assessment & Plan: Patient returns post femoral pinning 427/2019.  He comes in today does not have his walker and states the walker is at home and he has been noncompliant.  Chief Complaint:  Chief Complaint  Patient presents with  . Left Hip - Follow-up, Routine Post Op   Visit Diagnoses:  1. Type I or II open fracture of left hip, sequela   2. Left displaced femoral neck fracture (HCC)     Plan: Staples are harvested.  I discussed with him at length that he needs to be nonweightbearing until he returns.  He has a walker at home but he needs to use it.  Repeat x-rays on return in 3 weeks AP and frog-leg lateral left hip.  Follow-Up Instructions: Return in about 3 weeks (around 05/26/2018).   Orders:  Orders Placed This Encounter  Procedures  . XR HIP UNILAT W OR W/O PELVIS 2-3 VIEWS LEFT   No orders of the defined types were placed in this encounter.   Imaging: No results found.  PMFS History: Patient Active Problem List   Diagnosis Date Noted  . Adjustment disorder with mixed disturbance of emotions and conduct   . Hip fracture (HCC) 04/17/2018  . Left displaced femoral neck fracture (HCC)    No past medical history on file.  Family History  Family history unknown: Yes    Past Surgical History:  Procedure Laterality Date  . Arm Tendon repair Right   . FEMUR IM NAIL Left 04/18/2018   Procedure: LEFT FEMORAL NECK PINNING;  Surgeon: Eldred Manges, MD;  Location: [facility] OR;  Service: Orthopedics;  Laterality: Left;  . HERNIA REPAIR     Social History   Occupational History  . Not on file  Tobacco Use  . Smoking status: Current Every Day Smoker  . Smokeless tobacco: Never Used  Substance and Sexual Activity  . Alcohol use: Yes  . Drug use: No  . Sexual activity: Not on file

## 2018-05-26 ENCOUNTER — Ambulatory Visit (INDEPENDENT_AMBULATORY_CARE_PROVIDER_SITE_OTHER): Payer: Self-pay

## 2018-05-26 ENCOUNTER — Ambulatory Visit (INDEPENDENT_AMBULATORY_CARE_PROVIDER_SITE_OTHER): Payer: Self-pay | Admitting: Orthopaedic Surgery

## 2018-05-26 ENCOUNTER — Encounter (INDEPENDENT_AMBULATORY_CARE_PROVIDER_SITE_OTHER): Payer: Self-pay | Admitting: Orthopaedic Surgery

## 2018-05-26 VITALS — BP 132/77 | HR 82 | Ht 72.0 in | Wt 150.0 lb

## 2018-05-26 DIAGNOSIS — S72002S Fracture of unspecified part of neck of left femur, sequela: Secondary | ICD-10-CM

## 2018-05-26 NOTE — Progress Notes (Signed)
   Post-Op Visit Note   Patient: Kyle Sherman           Date of Birth: 1996/10/04           MRN: [id number] Visit Date: 05/26/2018 PCP: Patient, No Pcp Per   Assessment & Plan:   Chief Complaint:  Chief Complaint  Patient presents with  . Left Hip - Follow-up   Visit Diagnoses:  1. Type I or Sherman open fracture of left hip, sequela     Plan: Patient requested note for work.  He has a child and a girlfriend and needs to be working.  He has a job interview.  I discussed that I like him to continue to use his crutches at all times except when he is at work.  We discussed x-rays that showed that he has had some slight backout of the screws with shortening of the neck but there is no evidence of screw cut out through the head.  He still has a round concentric femoral head.  Patient is agreement with the outlined plan.  His mom is on the phone throughout today's visit and will reinforce him using his crutches as we have outlined.  Work slip given for okay for work.  I plan to recheck him in 2 months we will repeat x-rays of his hip.  He gets total resolution of his hip pain that he can gradually resume workout activities after that point but will make that decision in 2 months when he returns.  Follow-Up Instructions: No follow-ups on file.   Orders:  Orders Placed This Encounter  Procedures  . XR HIP UNILAT W OR W/O PELVIS 2-3 VIEWS LEFT   No orders of the defined types were placed in this encounter.   Imaging: No results found.  PMFS History: Patient Active Problem List   Diagnosis Date Noted  . Noncompliance 05/05/2018  . Adjustment disorder with mixed disturbance of emotions and conduct   . Hip fracture (HCC) 04/17/2018  . Left displaced femoral neck fracture (HCC)    No past medical history on file.  Family History  Family history unknown: Yes    Past Surgical History:  Procedure Laterality Date  . Arm Tendon repair Right   . FEMUR IM NAIL Left 04/18/2018   Procedure: LEFT FEMORAL NECK PINNING;  Surgeon: Eldred MangesYates, Mark C, MD;  Location: [facility] OR;  Service: Orthopedics;  Laterality: Left;  . HERNIA REPAIR     Social History   Occupational History  . Not on file  Tobacco Use  . Smoking status: Current Every Day Smoker  . Smokeless tobacco: Never Used  Substance and Sexual Activity  . Alcohol use: Yes  . Drug use: No  . Sexual activity: Not on file

## 2018-08-04 ENCOUNTER — Ambulatory Visit (INDEPENDENT_AMBULATORY_CARE_PROVIDER_SITE_OTHER): Payer: Self-pay | Admitting: Orthopaedic Surgery
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
## 2019-09-17 IMAGING — RF DG HIP (WITH PELVIS) OPERATIVE*L*
1 series · 3 of 3 positions shown · non-contrast
Comparison: 04/17/2018

CLINICAL DATA: Portable fluoroscopic imaging for left hip fracture
ORIF.

EXAM:
DG C-ARM 61-120 MIN; OPERATIVE LEFT HIP WITH PELVIS

[Series 1: run · 3 of 3 slices shown]
[im 1/3]
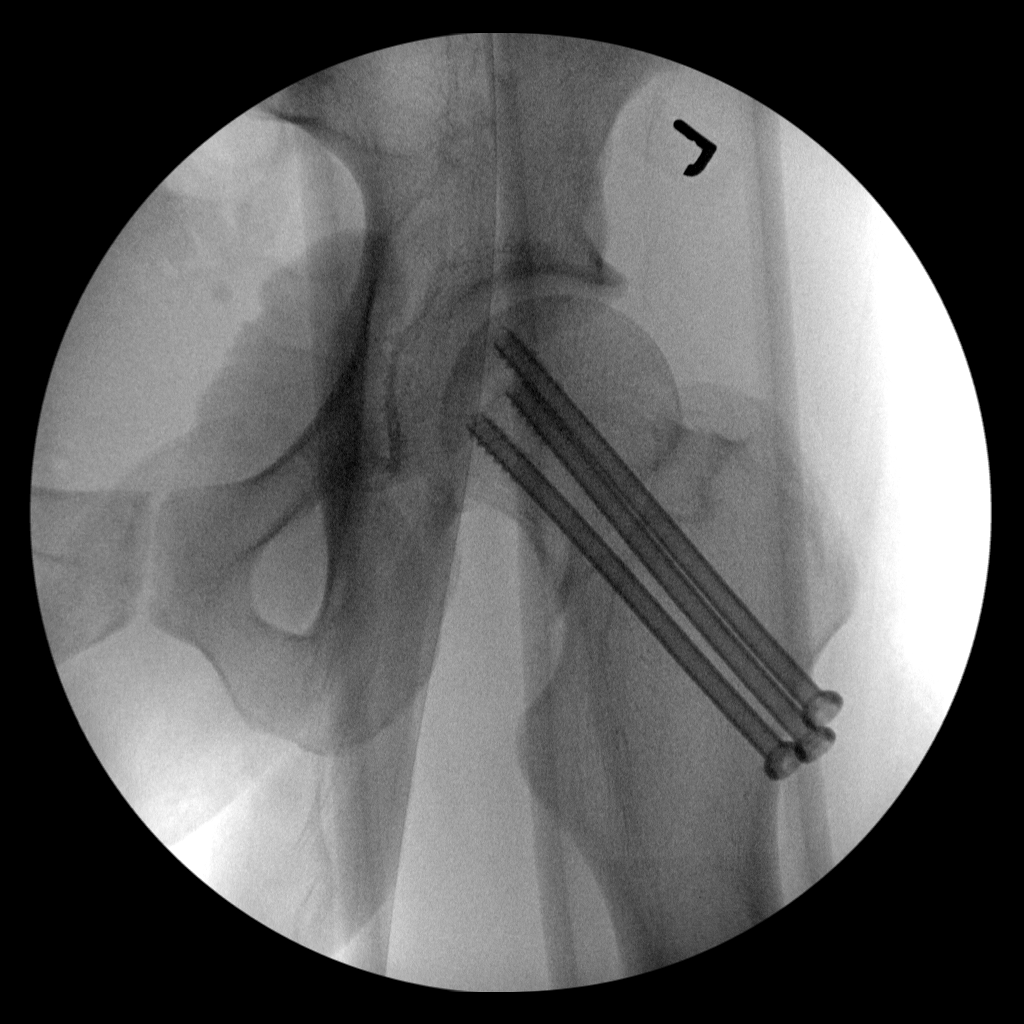
[im 2/3]
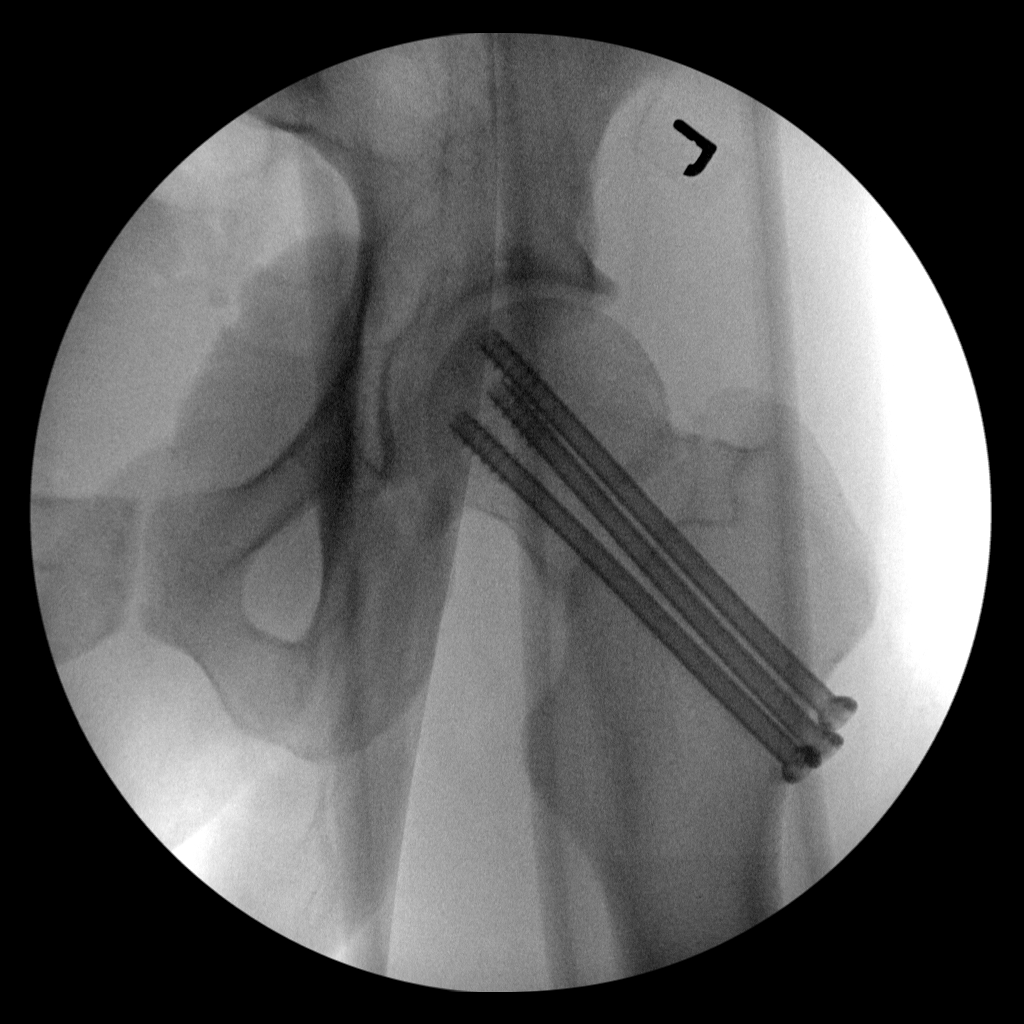
[im 3/3]
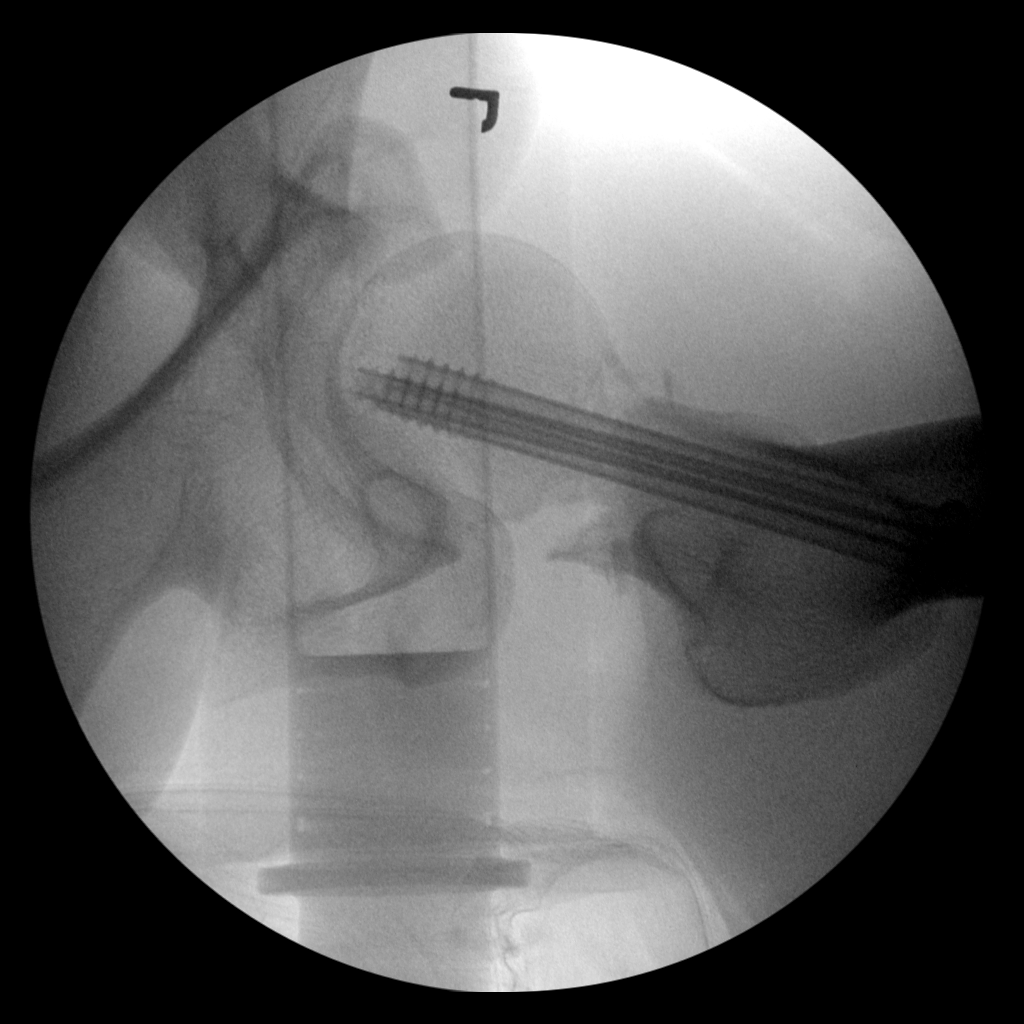

[3 of 3 positions shown; findings below may reference images not displayed]

FINDINGS: Operative images show placement of 3 screws across the left femoral
neck fracture, reducing the fracture components into near anatomic
alignment. The orthopedic hardware appears well-seated. Left hip
joint is normally aligned. No acute fracture or evidence of an
operative complication.
IMPRESSION: Well-aligned left femoral neck fracture following ORIF
